# Patient Record
Sex: Female | Born: 1975 | Race: Black or African American | Hispanic: No | Marital: Single | Smoking: Current every day smoker
Health system: Southern US, Community
[De-identification: ages and names within clinical notes are randomized; demographics above are authoritative.]

## PROBLEM LIST (undated history)

## (undated) DIAGNOSIS — M549 Dorsalgia, unspecified: Secondary | ICD-10-CM

## (undated) DIAGNOSIS — F32A Depression, unspecified: Secondary | ICD-10-CM

## (undated) DIAGNOSIS — J45909 Unspecified asthma, uncomplicated: Secondary | ICD-10-CM

## (undated) DIAGNOSIS — F419 Anxiety disorder, unspecified: Secondary | ICD-10-CM

## (undated) DIAGNOSIS — F329 Major depressive disorder, single episode, unspecified: Secondary | ICD-10-CM

## (undated) HISTORY — PX: BACK SURGERY: SHX140

## (undated) HISTORY — PX: JOINT REPLACEMENT: SHX530

## (undated) HISTORY — PX: CHOLECYSTECTOMY: SHX55

---

## 2014-04-17 ENCOUNTER — Emergency Department (HOSPITAL_COMMUNITY)
Admission: EM | Admit: 2014-04-17 | Discharge: 2014-04-17 | Disposition: A | Discharge status: Home / Self Care | Payer: Medicaid Other | Attending: Emergency Medicine | Admitting: Emergency Medicine

## 2014-04-17 ENCOUNTER — Emergency Department (HOSPITAL_COMMUNITY): Payer: Medicaid Other

## 2014-04-17 ENCOUNTER — Encounter (HOSPITAL_COMMUNITY): Payer: Self-pay | Admitting: Emergency Medicine

## 2014-04-17 DIAGNOSIS — J45909 Unspecified asthma, uncomplicated: Secondary | ICD-10-CM | POA: Insufficient documentation

## 2014-04-17 DIAGNOSIS — Z720 Tobacco use: Secondary | ICD-10-CM | POA: Insufficient documentation

## 2014-04-17 DIAGNOSIS — Z8659 Personal history of other mental and behavioral disorders: Secondary | ICD-10-CM | POA: Insufficient documentation

## 2014-04-17 DIAGNOSIS — Z79899 Other long term (current) drug therapy: Secondary | ICD-10-CM | POA: Insufficient documentation

## 2014-04-17 DIAGNOSIS — M25561 Pain in right knee: Secondary | ICD-10-CM | POA: Insufficient documentation

## 2014-04-17 HISTORY — DX: Anxiety disorder, unspecified: F41.9

## 2014-04-17 HISTORY — DX: Major depressive disorder, single episode, unspecified: F32.9

## 2014-04-17 HISTORY — DX: Unspecified asthma, uncomplicated: J45.909

## 2014-04-17 HISTORY — DX: Depression, unspecified: F32.A

## 2014-04-17 HISTORY — DX: Dorsalgia, unspecified: M54.9

## 2014-04-17 MED ORDER — OXYCODONE-ACETAMINOPHEN 5-325 MG PO TABS
2.0000 | ORAL_TABLET | Freq: Once | ORAL | Status: AC
  Administered 2014-04-17: 2 via ORAL
  Filled 2014-04-17: qty 2

## 2014-04-17 MED ORDER — OXYCODONE-ACETAMINOPHEN 5-325 MG PO TABS: 2.0000 | ORAL_TABLET | ORAL | Status: AC | PRN

## 2014-04-17 NOTE — ED Notes (Signed)
Patient states she has just moved here from [place].  Patient states she is with pain management in [place], but has not transferred to a new one in [place] yet.   Patient states is out of pain medicine that she got last month for chronic pain.

## 2014-04-17 NOTE — Progress Notes (Signed)
  CARE MANAGEMENT ED NOTE 04/17/2014  Patient:  Lindsey Pennington,Lindsey Pennington   Account Number:  [id number]  Date Initiated:  04/17/2014  Documentation initiated by:  Ferdinand CavaSCHETTINO,Manveer Gomes  Subjective/Objective Assessment:   38 yo female presenitng to the ED with c/o pain     Subjective/Objective Assessment Detail:     Action/Plan:   Patient is encouraged to contact DSS for PCP information or find one from the provided list   Action/Plan Detail:   Anticipated DC Date:       Status Recommendation to Physician:   Result of Recommendation:  Agreed    DC Planning Services  CM consult  PCP issues    Choice offered to / List presented to:  C-1 Patient          Status of service:  Completed, signed off  ED Comments:   ED Comments Detail:  CM consulted to assist with PCP resources. CM spoke with patient regarding ED visit and PCP status. The patient stated that [place] [facility] [place]. In [place] she had Medicaid due to receiving disability. She stated that she transferred her Medicaid but has not received her card yet. She stated that she called the DSS office and received her Medicaid # and uses that medical treatment payment. This CM encouraged her to contact DSS and ask if she has already been provided a PCP. Provided the patient a list of Medicaid accepting PCP's in [place] [facility] [place] and explained that if she has not been provided a PCP she can locate on from the list. Explained that she has to call and make sure the provider is accepting new Medicaid patients and then she has to contact DSS and have the new provider placed on the card. The patient verbalized understanding and had no other questions or concerns.

## 2014-04-17 NOTE — ED Notes (Signed)
Pt in c/o right knee pain, states she has that knee replaced and periodically has pain there, denies new injury, reports some swelling but it has improved.

## 2014-04-17 NOTE — ED Provider Notes (Signed)
CSN: [id number]     Arrival date & time 04/17/14  1124 History   First MD Initiated Contact with Patient 04/17/14 1133     Chief Complaint  Patient presents with  . 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 Symptoms started gradually and progressively worsened since the onset. Patient denies any injury. The pain is aching and severe without radiation. Patient reports being out of Percocet which typically helps with her pain. Weight bearing activity makes the pain worse. No alleviating factors. No other associated symptoms.    Past Medical History  Diagnosis Date  . Asthma   . Depression   . Anxiety   . Back pain    Past Surgical History  Procedure Laterality Date  . Joint replacement    . Back surgery    . Cholecystectomy     History reviewed. No pertinent family history. History  Substance Use Topics  . Smoking status: Current Every Day Smoker  . Smokeless tobacco: Not on file  . Alcohol Use: Yes   OB History   Grav Para Term Preterm Abortions TAB SAB Ect Mult Living                 Review of Systems  Constitutional: Negative for fever, chills and fatigue.  HENT: Negative for trouble swallowing.   Eyes: Negative for visual disturbance.  Respiratory: Negative for shortness of breath.   Cardiovascular: Negative for chest pain and palpitations.  Gastrointestinal: Negative for nausea, vomiting, abdominal pain and diarrhea.  Genitourinary: Negative for dysuria and difficulty urinating.  Musculoskeletal: Positive for arthralgias. Negative for neck pain.  Skin: Negative for color change.  Neurological: Negative for dizziness and weakness.  Psychiatric/Behavioral: Negative for dysphoric mood.      Allergies  Review of patient's allergies  indicates no known allergies.  Home Medications   Prior to Admission medications   Medication Sig Start Date End Date Taking? Authorizing Provider  albuterol (PROVENTIL HFA;VENTOLIN HFA) 108 (90 BASE) MCG/ACT inhaler Inhale 1-2 puffs into the lungs every 6 (six) hours as needed for wheezing or shortness of breath.   Yes Historical Provider, MD  cyclobenzaprine (FLEXERIL) 5 MG tablet Take 5 mg by mouth 3 (three) times daily as needed for muscle spasms.   Yes Historical Provider, MD  ibuprofen (ADVIL,MOTRIN) 600 MG tablet Take 600 mg by mouth every 6 (six) hours as needed for moderate pain.   Yes Historical Provider, MD  oxyCODONE-acetaminophen (PERCOCET/ROXICET) 5-325 MG per tablet Take 1-2 tablets by mouth every 4 (four) hours as needed for severe pain.   Yes Historical Provider, MD  pregabalin (LYRICA) 150 MG capsule Take 150 mg by mouth 2 (two) times daily.   Yes Historical Provider, MD  tiZANidine (ZANAFLEX) 4 MG tablet Take 4 mg by mouth at bedtime.   Yes Historical Provider, MD   BP 124/84  Pulse 94  Temp(Src) 98.4 F (36.9 C) (Oral)  Resp 22  SpO2 99% Physical Exam  Nursing note and vitals reviewed. Constitutional: She is oriented to person, place, and time. She appears well-developed and well-nourished. No distress.  HENT:  Head: Normocephalic and atraumatic.  Eyes: Conjunctivae are normal.  Neck: Normal range of motion.  Cardiovascular: Normal rate, regular rhythm and intact distal pulses.  Exam reveals no gallop and no friction rub.   No  murmur heard. Pulmonary/Chest: Effort normal and breath sounds normal. She has no wheezes. She has no rales. She exhibits no tenderness.  Abdominal: Soft. There is no tenderness.  Musculoskeletal: Normal range of motion.  Right knee tenderness to palpation and the proximal area of knee. No obvious deformity or edema noted. Slightly limited ROM due to pain.   Neurological: She is alert and oriented to person, place, and time. Coordination  normal.  Speech is goal-oriented. Moves limbs without ataxia.   Skin: Skin is warm and dry.  Psychiatric: She has a normal mood and affect. Her behavior is normal.    ED Course  Procedures (including critical care time) Labs Review Labs Reviewed - No data to display   Imaging Review No results found.   EKG Interpretation None      MDM   Final diagnoses:  Right knee pain    12:14 PM Xray of right knee pending. Patient will have Percocet for pain. Vitals stable and patient afebrile.   2:04 PM Xray unremarkable for acute changes. Patient will have a small course of Percocet prescription and instructions to follow up with Orthopedics and PCP. Case management saw the patient who provided a list of PCP resources.    Emilia BeckKaitlyn Madgeline Rayo, PA-C 04/17/14 1553

## 2014-04-17 NOTE — Discharge Instructions (Signed)
Take Percocet as needed for pain. Follow up with the recommended Orthopedic doctor for further evaluation. Use the resource guide provided to find a primary care provider.

## 2014-04-18 NOTE — ED Provider Notes (Signed)
Medical screening examination/treatment/procedure(s) were performed by non-physician practitioner and as supervising physician I was immediately available for consultation/collaboration.   EKG Interpretation None        Vanetta MuldersScott Anistyn Graddy, MD 04/18/14 2320

## 2014-05-11 ENCOUNTER — Encounter (HOSPITAL_COMMUNITY): Payer: Self-pay | Admitting: Family Medicine

## 2014-05-11 ENCOUNTER — Emergency Department (HOSPITAL_COMMUNITY)
Admission: EM | Admit: 2014-05-11 | Discharge: 2014-05-11 | Disposition: A | Discharge status: Home / Self Care | Payer: Medicaid Other | Attending: Emergency Medicine | Admitting: Emergency Medicine

## 2014-05-11 DIAGNOSIS — Y998 Other external cause status: Secondary | ICD-10-CM | POA: Insufficient documentation

## 2014-05-11 DIAGNOSIS — Z79899 Other long term (current) drug therapy: Secondary | ICD-10-CM | POA: Diagnosis not present

## 2014-05-11 DIAGNOSIS — M7918 Myalgia, other site: Secondary | ICD-10-CM

## 2014-05-11 DIAGNOSIS — S3992XA Unspecified injury of lower back, initial encounter: Secondary | ICD-10-CM | POA: Insufficient documentation

## 2014-05-11 DIAGNOSIS — W108XXA Fall (on) (from) other stairs and steps, initial encounter: Secondary | ICD-10-CM | POA: Diagnosis not present

## 2014-05-11 DIAGNOSIS — Y9389 Activity, other specified: Secondary | ICD-10-CM | POA: Diagnosis not present

## 2014-05-11 DIAGNOSIS — Z720 Tobacco use: Secondary | ICD-10-CM | POA: Diagnosis not present

## 2014-05-11 DIAGNOSIS — Z8659 Personal history of other mental and behavioral disorders: Secondary | ICD-10-CM | POA: Insufficient documentation

## 2014-05-11 DIAGNOSIS — J45909 Unspecified asthma, uncomplicated: Secondary | ICD-10-CM | POA: Insufficient documentation

## 2014-05-11 DIAGNOSIS — Y9289 Other specified places as the place of occurrence of the external cause: Secondary | ICD-10-CM | POA: Insufficient documentation

## 2014-05-11 MED ORDER — CYCLOBENZAPRINE HCL 10 MG PO TABS: 10.0000 mg | ORAL_TABLET | Freq: Two times a day (BID) | ORAL | Status: AC | PRN

## 2014-05-11 MED ORDER — NAPROXEN 500 MG PO TABS: 500.0000 mg | ORAL_TABLET | Freq: Two times a day (BID) | ORAL | Status: AC

## 2014-05-11 NOTE — ED Notes (Signed)
Per pt sts fall a few days ago and landed on her lower back. sts pain in lower back.

## 2014-05-11 NOTE — ED Provider Notes (Signed)
CSN: [id number]     Arrival date & time 05/11/14  1514 History  This chart was scribed for non-physician practitioner working with Lindsey BuccoMelanie Belfi, MD by Elveria Risingimelie Horne, ED Scribe. This patient was seen in room TR07C/TR07C and the patient's care was started at 5:00 PM.   Chief Complaint  Patient presents with  . Back Pain   The history is provided by the patient. No language interpreter was used.   HPI Comments: Lindsey Pennington is a 38 y.o. female with history of degenerative disc disease who presents to the Emergency Department complaining of acute on chronic lower back pain after a fall down the stairs four days ago. Patient reports slipping on a wet step while descending the stairs and sliding to the last step in a seated position. Patient reports constant back pain since her fall that is exacerbated pain described as spasms with lying down and movement.  Patient reports attempted treatment with ice and denies improvement. Patient denies bowel/bladder incontinence.   Past Medical History  Diagnosis Date  . Asthma   . Depression   . Anxiety   . Back pain    Past Surgical History  Procedure Laterality Date  . Joint replacement    . Back surgery    . Cholecystectomy     History reviewed. No pertinent family history. History  Substance Use Topics  . Smoking status: Current Every Day Smoker  . Smokeless tobacco: Not on file  . Alcohol Use: Yes   OB History    No data available     Review of Systems  Constitutional: Negative for fever and chills.  Gastrointestinal: Negative for diarrhea and constipation.  Genitourinary: Negative for dysuria and hematuria.  Musculoskeletal: Positive for back pain. Negative for gait problem.  Skin: Negative for rash.  Neurological: Negative for weakness and numbness.  All other systems reviewed and are negative.   Allergies  Review of patient's allergies indicates no known allergies.  Home Medications   Prior to Admission medications    Medication Sig Start Date End Date Taking? Authorizing Provider  albuterol (PROVENTIL HFA;VENTOLIN HFA) 108 (90 BASE) MCG/ACT inhaler Inhale 1-2 puffs into the lungs every 6 (six) hours as needed for wheezing or shortness of breath.   Yes Historical Provider, MD  ibuprofen (ADVIL,MOTRIN) 200 MG tablet Take 400 mg by mouth every 6 (six) hours as needed for mild pain.   Yes Historical Provider, MD  oxyCODONE-acetaminophen (PERCOCET/ROXICET) 5-325 MG per tablet Take 2 tablets by mouth every 4 (four) hours as needed for moderate pain or severe pain. Patient not taking: Reported on 05/11/2014 04/17/14   Emilia BeckKaitlyn Szekalski, PA-C   Triage Vitals: BP 126/80 mmHg  Pulse 90  Temp(Src) 98.1 F (36.7 C)  Resp 18  Ht 5\' 1"  (1.549 m)  Wt 189 lb (85.73 kg)  BMI 35.73 kg/m2  SpO2 100%  Physical Exam  Constitutional: She is oriented to person, place, and time. She appears well-developed and well-nourished. No distress.  HENT:  Head: Normocephalic and atraumatic.  Eyes: EOM are normal.  Neck: Neck supple. No tracheal deviation present.  Cardiovascular: Normal rate.   Pulmonary/Chest: Effort normal. No respiratory distress.  Musculoskeletal: Normal range of motion.  No midline tenderness. Pain to sides of lumbar spine.   Neurological: She is alert and oriented to person, place, and time.  Skin: Skin is warm and dry.  Psychiatric: She has a normal mood and affect. Her behavior is normal.  Nursing note and vitals reviewed.   ED Course  Procedures (including critical care time)  COORDINATION OF CARE: 5:03 PM- Will prescribe muscle relaxer to alleviate spasming. Discussed treatment plan with patient at bedside and patient agreed to plan.   Labs Review Labs Reviewed - No data to display  Imaging Review No results found.   EKG Interpretation None     Exacerbation of chronic back pain. No red flag symptoms. MDM   Final diagnoses:  None      I personally performed the services  described in this documentation, which was scribed in my presence. The recorded information has been reviewed and is accurate.    Jimmye Normanavid John Reo Portela, NP 05/12/14 [id number]  Lindsey BuccoMelanie Belfi, MD 05/12/14 1515

## 2014-05-11 NOTE — Discharge Instructions (Signed)

## 2014-06-25 ENCOUNTER — Ambulatory Visit: Payer: Medicaid Other | Admitting: Physical Therapy

## 2014-06-28 ENCOUNTER — Ambulatory Visit: Payer: Medicaid Other | Attending: Orthopedic Surgery | Admitting: Physical Therapy

## 2014-06-28 DIAGNOSIS — Z96651 Presence of right artificial knee joint: Secondary | ICD-10-CM | POA: Diagnosis not present

## 2014-06-28 DIAGNOSIS — M25561 Pain in right knee: Secondary | ICD-10-CM | POA: Diagnosis present

## 2014-11-30 ENCOUNTER — Other Ambulatory Visit: Payer: Self-pay | Admitting: Family Medicine

## 2014-11-30 DIAGNOSIS — R102 Pelvic and perineal pain: Secondary | ICD-10-CM

## 2014-11-30 DIAGNOSIS — Z1231 Encounter for screening mammogram for malignant neoplasm of breast: Secondary | ICD-10-CM

## 2014-12-25 ENCOUNTER — Other Ambulatory Visit: Payer: Medicaid Other

## 2014-12-28 ENCOUNTER — Ambulatory Visit
Admission: RE | Admit: 2014-12-28 | Discharge: 2014-12-28 | Disposition: A | Discharge status: Home / Self Care | Payer: Medicaid Other | Source: Ambulatory Visit | Attending: Family Medicine | Admitting: Family Medicine

## 2014-12-28 DIAGNOSIS — Z1231 Encounter for screening mammogram for malignant neoplasm of breast: Secondary | ICD-10-CM

## 2015-01-01 ENCOUNTER — Other Ambulatory Visit: Payer: Self-pay | Admitting: Family Medicine

## 2015-01-01 DIAGNOSIS — R928 Other abnormal and inconclusive findings on diagnostic imaging of breast: Secondary | ICD-10-CM

## 2015-01-07 ENCOUNTER — Other Ambulatory Visit: Payer: Medicaid Other

## 2015-01-11 ENCOUNTER — Ambulatory Visit
Admission: RE | Admit: 2015-01-11 | Discharge: 2015-01-11 | Disposition: A | Discharge status: Home / Self Care | Payer: Medicaid Other | Source: Ambulatory Visit | Attending: Family Medicine | Admitting: Family Medicine

## 2015-01-11 ENCOUNTER — Other Ambulatory Visit: Payer: Self-pay | Admitting: Family Medicine

## 2015-01-11 DIAGNOSIS — R928 Other abnormal and inconclusive findings on diagnostic imaging of breast: Secondary | ICD-10-CM

## 2015-07-09 IMAGING — CR DG KNEE COMPLETE 4+V*R*
4 series · 4 of 4 positions shown · non-contrast
Comparison: None.

CLINICAL DATA: Chronic right knee pain.  No known injury

EXAM:
RIGHT KNEE - COMPLETE 4+ VIEW

[t knee ap right]
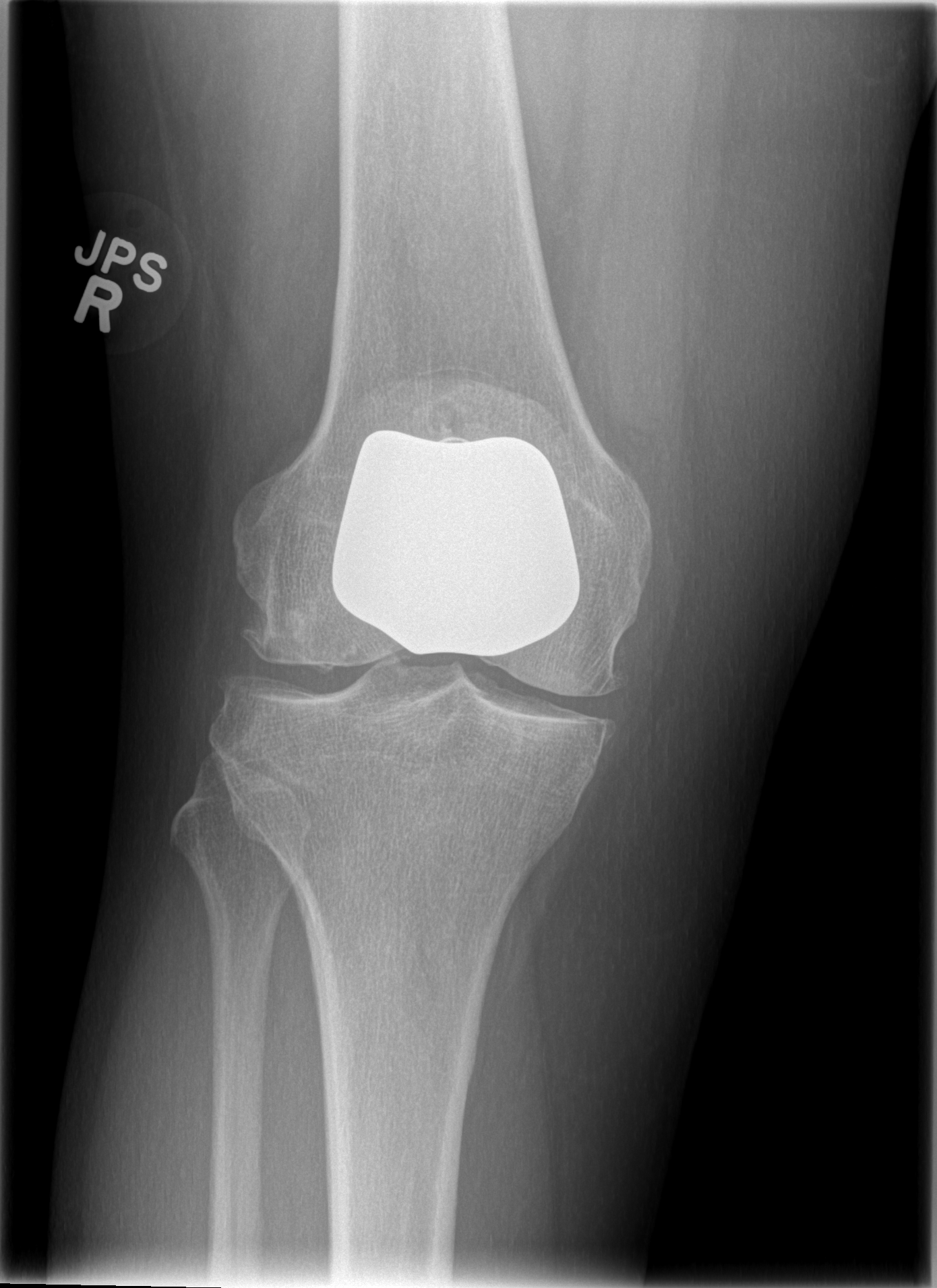

[t knee oblique right (1 of 2)]
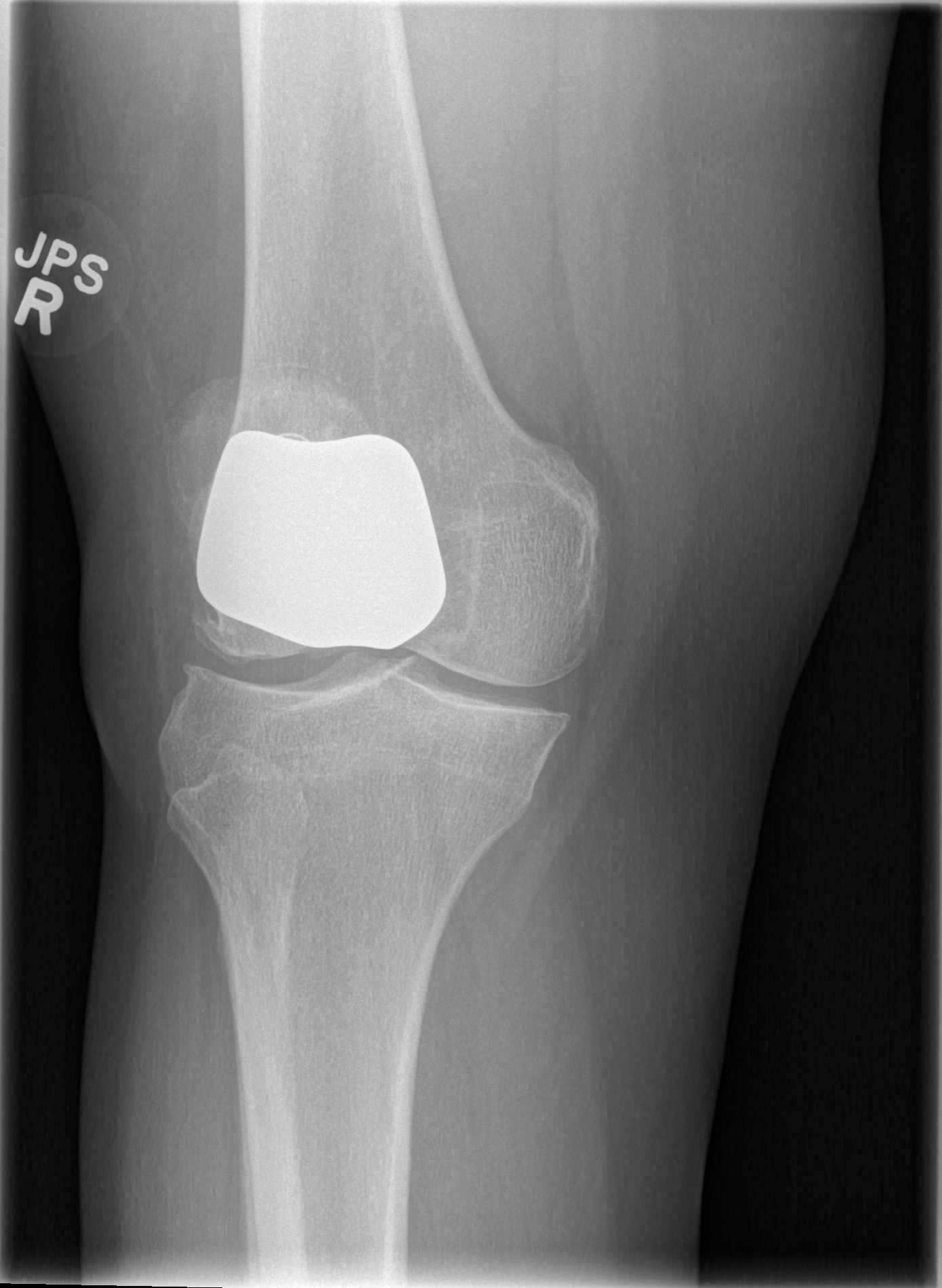

[t knee oblique right (2 of 2)]
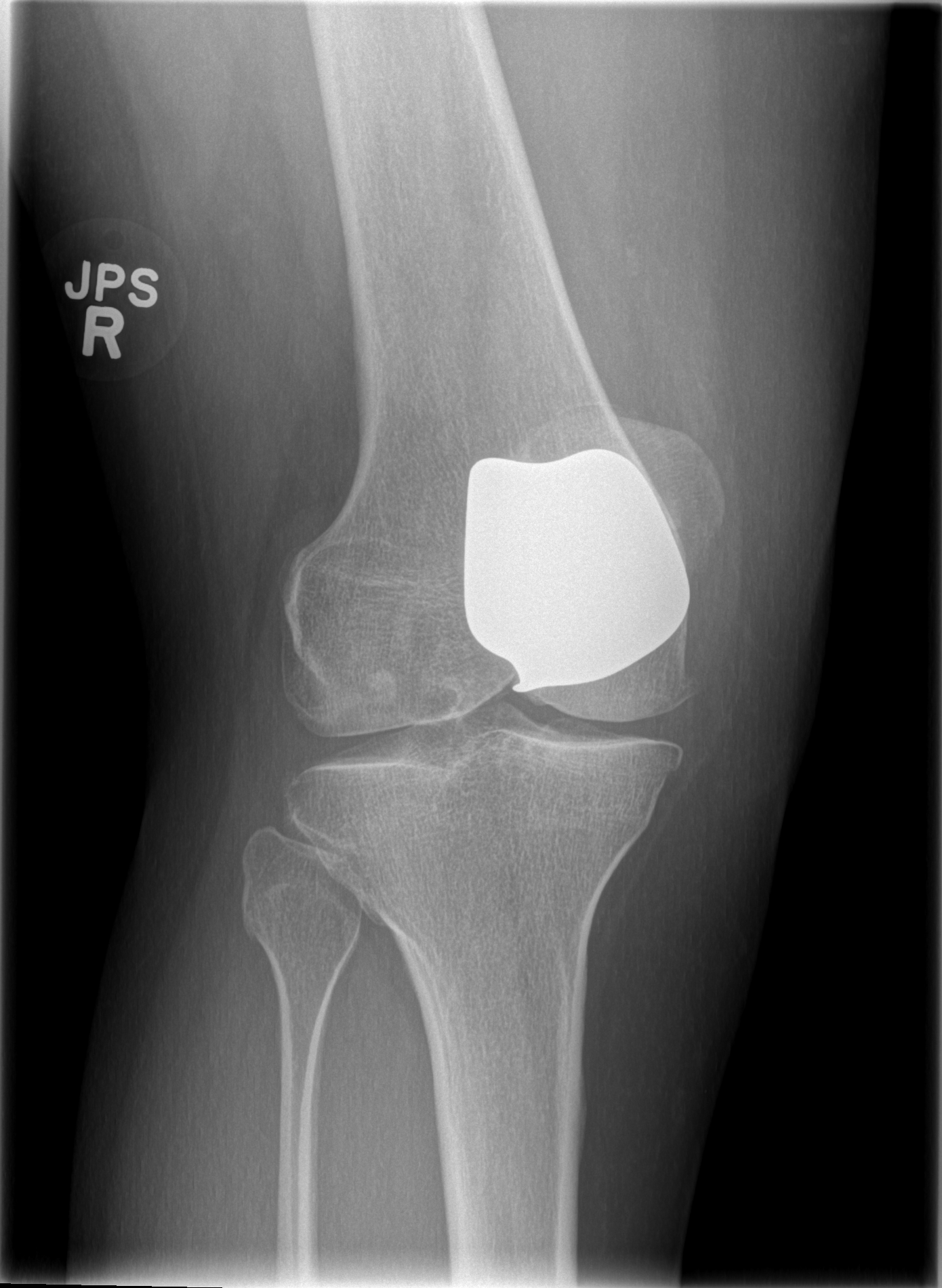

[t knee lat right]
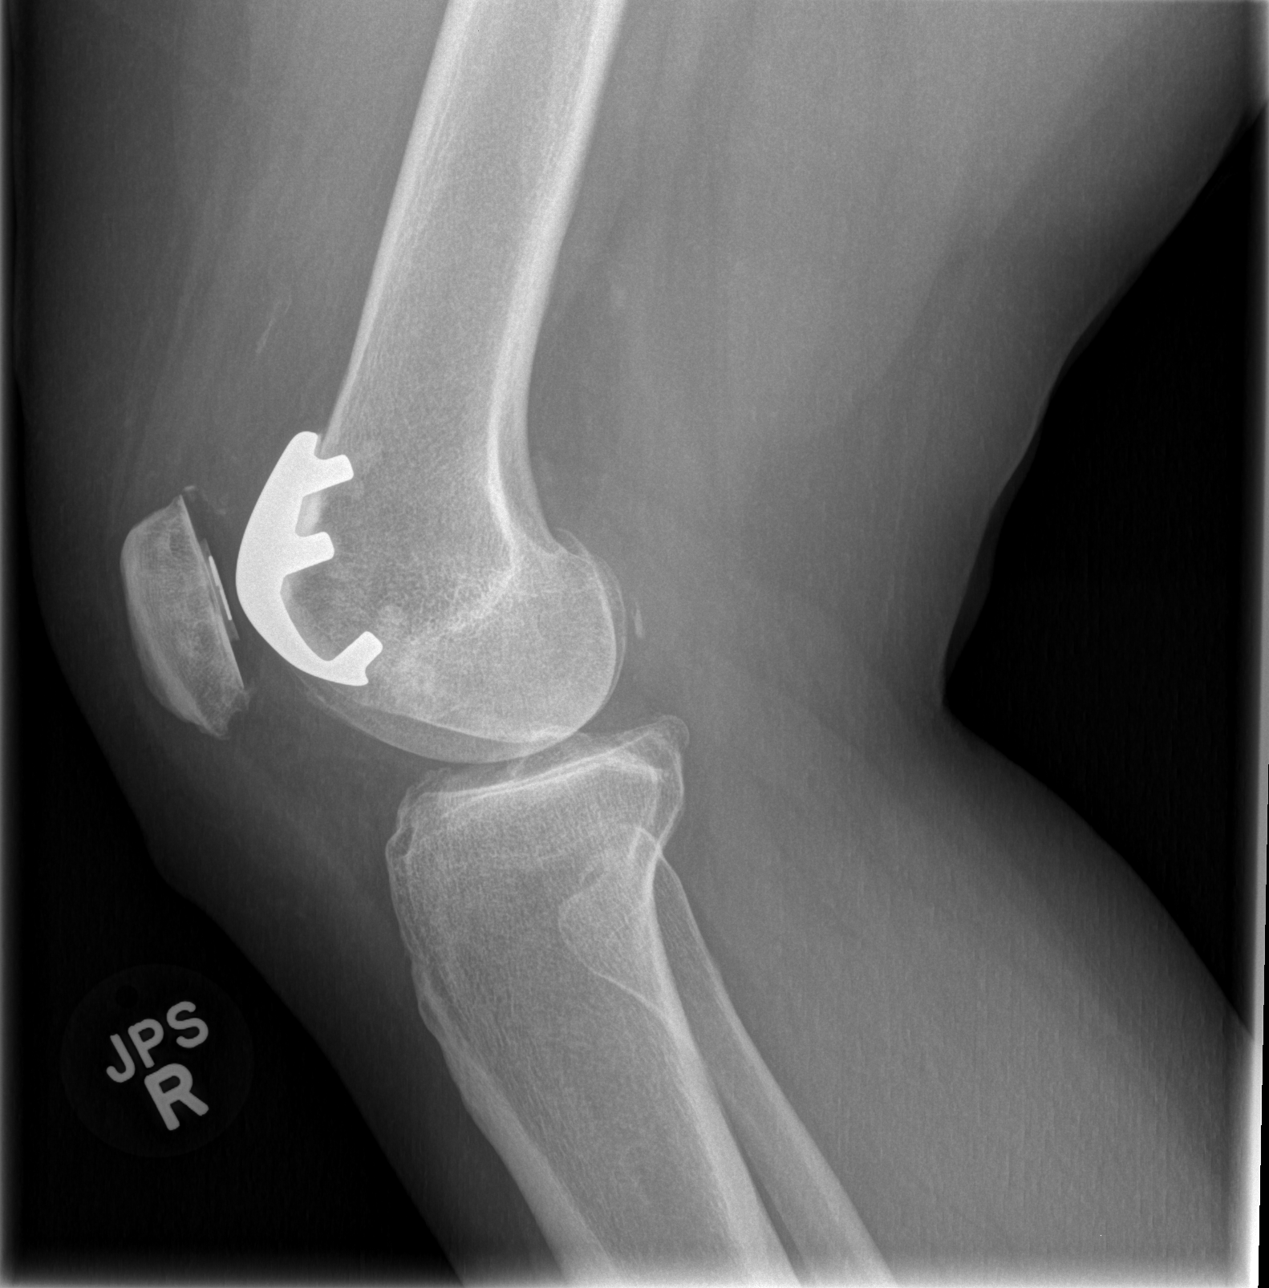

[4 of 4 positions shown; findings below may reference images not displayed]

FINDINGS: Postsurgical changes are noted in the distal femur and patella. No
acute bony abnormality is seen. Mild degenerative changes of the
medial joint space and lateral joint space are noted. No soft tissue
changes are seen.
IMPRESSION: Postsurgical changes without acute abnormality.
